# Patient Record
Sex: Female | Born: 2000 | Race: White | Hispanic: No | Marital: Single | Smoking: Never smoker
Health system: Southern US, Community
[De-identification: ages and names within clinical notes are randomized; demographics above are authoritative.]

## PROBLEM LIST (undated history)

## (undated) DIAGNOSIS — F4310 Post-traumatic stress disorder, unspecified: Secondary | ICD-10-CM

## (undated) DIAGNOSIS — F319 Bipolar disorder, unspecified: Secondary | ICD-10-CM

## (undated) DIAGNOSIS — F988 Other specified behavioral and emotional disorders with onset usually occurring in childhood and adolescence: Secondary | ICD-10-CM

## (undated) HISTORY — DX: Post-traumatic stress disorder, unspecified: F43.10

## (undated) HISTORY — PX: HYMENECTOMY: SHX987

## (undated) HISTORY — DX: Other specified behavioral and emotional disorders with onset usually occurring in childhood and adolescence: F98.8

## (undated) HISTORY — DX: Bipolar disorder, unspecified: F31.9

---
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
## 2018-08-15 ENCOUNTER — Emergency Department: Payer: Self-pay

## 2018-08-15 ENCOUNTER — Emergency Department
Admission: EM | Admit: 2018-08-15 | Discharge: 2018-08-15 | Disposition: A | Discharge status: Home / Self Care | Payer: Self-pay | Attending: Emergency Medicine | Admitting: Emergency Medicine

## 2018-08-15 ENCOUNTER — Encounter: Payer: Self-pay | Admitting: Emergency Medicine

## 2018-08-15 DIAGNOSIS — Z3A00 Weeks of gestation of pregnancy not specified: Secondary | ICD-10-CM | POA: Insufficient documentation

## 2018-08-15 DIAGNOSIS — R1084 Generalized abdominal pain: Secondary | ICD-10-CM | POA: Insufficient documentation

## 2018-08-15 DIAGNOSIS — O2341 Unspecified infection of urinary tract in pregnancy, first trimester: Secondary | ICD-10-CM

## 2018-08-15 DIAGNOSIS — O4690 Antepartum hemorrhage, unspecified, unspecified trimester: Secondary | ICD-10-CM

## 2018-08-15 DIAGNOSIS — O2391 Unspecified genitourinary tract infection in pregnancy, first trimester: Secondary | ICD-10-CM | POA: Insufficient documentation

## 2018-08-15 DIAGNOSIS — O200 Threatened abortion: Secondary | ICD-10-CM | POA: Insufficient documentation

## 2018-08-15 LAB — CBC WITH DIFFERENTIAL/PLATELET
Basophils Absolute: 0 10*3/uL (ref 0–0.1)
Basophils Relative: 0 %
EOS PCT: 1 %
Eosinophils Absolute: 0.1 10*3/uL (ref 0–0.7)
HEMATOCRIT: 37.7 % (ref 35.0–47.0)
Hemoglobin: 13.4 g/dL (ref 12.0–16.0)
LYMPHS ABS: 2 10*3/uL (ref 1.0–3.6)
LYMPHS PCT: 22 %
MCH: 28.4 pg (ref 26.0–34.0)
MCHC: 35.4 g/dL (ref 32.0–36.0)
MCV: 80.2 fL (ref 80.0–100.0)
Monocytes Absolute: 0.3 10*3/uL (ref 0.2–0.9)
Monocytes Relative: 4 %
Neutro Abs: 6.6 10*3/uL — ABNORMAL HIGH (ref 1.4–6.5)
Neutrophils Relative %: 73 %
PLATELETS: 261 10*3/uL (ref 150–440)
RBC: 4.71 MIL/uL (ref 3.80–5.20)
RDW: 14.3 % (ref 11.5–14.5)
WBC: 9 10*3/uL (ref 3.6–11.0)

## 2018-08-15 LAB — URINALYSIS, ROUTINE W REFLEX MICROSCOPIC
BILIRUBIN URINE: NEGATIVE
Glucose, UA: NEGATIVE mg/dL
Ketones, ur: NEGATIVE mg/dL
LEUKOCYTES UA: NEGATIVE
Nitrite: POSITIVE — AB
Protein, ur: NEGATIVE mg/dL
SPECIFIC GRAVITY, URINE: 1.023 (ref 1.005–1.030)
pH: 5 (ref 5.0–8.0)

## 2018-08-15 LAB — HCG, QUANTITATIVE, PREGNANCY: hCG, Beta Chain, Quant, S: 262 m[IU]/mL — ABNORMAL HIGH (ref <5)

## 2018-08-15 LAB — ABO/RH: ABO/RH(D): B POS

## 2018-08-15 MED ORDER — CEPHALEXIN 500 MG PO CAPS: 500.0000 mg | ORAL_CAPSULE | Freq: Two times a day (BID) | ORAL | 0 refills | Status: DC

## 2018-08-15 NOTE — ED Notes (Signed)
Pelvic e

## 2018-08-15 NOTE — ED Notes (Signed)
NAD noted at time of D/C. Pt denies questions or concerns. Pt ambulatory to the lobby at this time.  

## 2018-08-15 NOTE — ED Triage Notes (Signed)
Pt reports being six weeks pregnant which caused concern when pt began to bleed and cramp today. Pt reports a gush of blood an hour prior to arrival.

## 2018-08-15 NOTE — Discharge Instructions (Signed)
You have been seen in the Emergency Department (ED) for vaginal bleeding during pregnancy, which is called a threatened abortion.  Based on your ultrasound, and the fact that there is no gestational sac nor fetus visible, most likely you are, in fact, having a miscarriage.   It is still important for you to follow up in two days (Friday), though, with an OB/GYN to have repeat lab work drawn (blood HCG level) to see if your number is going up or down.  Today your HCG level was As a result of your blood type, you did not receive an injection of medication called Rhogam - please let your OB/Gyn know.  Please follow up as recommended above.  If you develop any other symptoms that concern you (including, but not limited to, persistent vomiting, worsening bleeding, abdominal or pelvic pain, or fever greater than 101), please return immediately to the Emergency Department.

## 2018-08-15 NOTE — ED Notes (Signed)
Pelvic exam done   No specimen to lab.  Pt tolerated exam well.

## 2018-08-15 NOTE — ED Provider Notes (Signed)
[facility] Emergency Department Provider Note  ____________________________________________   First MD Initiated Contact with Patient 08/15/18 1910     (approximate)  I have reviewed the triage vital signs and the nursing notes.   HISTORY  Chief Complaint Vaginal Bleeding    HPI Emy Angevine is a 17 y.o. female G1P0 at about 6 weeks based on LMP.  She presents today for acute onset of which she describes as severe vaginal bleeding.  She went to the health department today to establish care and was told that she needs to come back in about 6 weeks for an ultrasound but that it was too early to tell very much.  She states that after she went home she fill out bleeding; she describes it as a gush of blood at the surgery acutely and has been continuing although she has not gone to the bathroom since that occurred.  As soon as it started she asked her boyfriend to bring her to the emergency department.  She says that the bleeding is accompanied with some lower abdominal cramping that is occasionally severe but is currently mild.  It hurts right in the middle of her abdomen.  She denies fever/chills, chest pain, shortness of breath, nausea, vomiting, and dysuria.  Nothing in particular makes her symptoms better nor worse.  She does not feel lightheaded, dizzy, nor weak.  History reviewed. No pertinent past medical history.  There are no active problems to display for this patient.   History reviewed. No pertinent surgical history.  Prior to Admission medications   Medication Sig Start Date End Date Taking? Authorizing Provider  cephALEXin (KEFLEX) 500 MG capsule Take 1 capsule (500 mg total) by mouth 2 (two) times daily. 08/15/18   Hinda Kehr, MD    Allergies Prednisone  No family history on file.  Social History Social History   Tobacco Use  . Smoking status: Never Smoker  . Smokeless tobacco: Never Used  Substance Use Topics  . Alcohol use: No    . Drug use: Not on file    Review of Systems Constitutional: No fever/chills Eyes: No visual changes. ENT: No sore throat. Cardiovascular: Denies chest pain. Respiratory: Denies shortness of breath. Gastrointestinal: Lower abdominal cramping as described above.  No nausea, no vomiting.  No diarrhea.  No constipation. Genitourinary: Acute onset heavy vaginal bleeding as described above.  Negative for dysuria. Musculoskeletal: Negative for neck pain.  Negative for back pain. Integumentary: Negative for rash. Neurological: Negative for headaches, focal weakness or numbness.   ____________________________________________   PHYSICAL EXAM:  VITAL SIGNS: ED Triage Vitals  Enc Vitals Group     BP 08/15/18 1821 140/76     Pulse Rate 08/15/18 1821 95     Resp 08/15/18 1821 18     Temp 08/15/18 1821 97.9 F (36.6 C)     Temp Source 08/15/18 1821 Oral     SpO2 08/15/18 1821 99 %     Weight 08/15/18 1822 77.1 kg (170 lb)     Height 08/15/18 1822 1.524 m (5')     Head Circumference --      Peak Flow --      Pain Score 08/15/18 1822 7     Pain Loc --      Pain Edu? --      Excl. in [place]? --     Constitutional: Alert and oriented. Well appearing and in no acute distress. Eyes: Conjunctivae are normal.  Head: Atraumatic. Nose: No congestion/rhinnorhea. Mouth/Throat: Mucous  membranes are moist. Neck: No stridor.  No meningeal signs.   Cardiovascular: Normal rate, regular rhythm. Good peripheral circulation. Grossly normal heart sounds. Respiratory: Normal respiratory effort.  No retractions. Lungs CTAB. Gastrointestinal: Soft and nontender. No distention.  Genitourinary: Normal external exam.  Moderate amount of dark blood in the vaginal vault, no products of conception visible.  I could not get the speculum in position to completely visualize the cervix (the exam was somewhat limited by body habitus ) but I could not identify any large clots, products of conception, or other emergent  conditions.  There was no active bleeding at the time of the exam.  ED chaperone present throughout exam. Musculoskeletal: No lower extremity tenderness nor edema. No gross deformities of extremities. Neurologic:  Normal speech and language. No gross focal neurologic deficits are appreciated.  Skin:  Skin is warm, dry and intact. No rash noted. Psychiatric: Mood and affect are normal. Speech and behavior are normal.  ____________________________________________   LABS (all labs ordered are listed, but only abnormal results are displayed)  Labs Reviewed  HCG, QUANTITATIVE, PREGNANCY - Abnormal; Notable for the following components:      Result Value   hCG, Beta Chain, Quant, S 262 (*)    All other components within normal limits  CBC WITH DIFFERENTIAL/PLATELET - Abnormal; Notable for the following components:   Neutro Abs 6.6 (*)    All other components within normal limits  URINALYSIS, ROUTINE W REFLEX MICROSCOPIC - Abnormal; Notable for the following components:   Color, Urine YELLOW (*)    APPearance HAZY (*)    Hgb urine dipstick MODERATE (*)    Nitrite POSITIVE (*)    Bacteria, UA MANY (*)    All other components within normal limits  ABO/RH   ____________________________________________  EKG  None - EKG not ordered by ED physician ____________________________________________  RADIOLOGY  ED MD interpretation: No gestational sac, intrauterine fetus, nor ectopic pregnancy is visible, either too early or status post spontaneous abortion  Official radiology report(s): US Ob Comp < 14 Wks  Result Date: 08/15/2018 CLINICAL DATA:  Initial evaluation for heavy vaginal bleeding for 1 day, early pregnancy. EXAM: OBSTETRIC <14 WK [place] AND TRANSVAGINAL OB US TECHNIQUE: Both transabdominal and transvaginal ultrasound examinations were performed for complete evaluation of the gestation as well as the maternal uterus, adnexal regions, and pelvic cul-de-sac. Transvaginal technique was  performed to assess early pregnancy. COMPARISON:  None. FINDINGS: Intrauterine gestational sac: None visualized. Yolk sac:  Negative. Embryo:  Negative. Cardiac Activity: N/A Heart Rate: N/A  bpm Subchorionic hemorrhage:  None visualized. Maternal uterus/adnexae: Right ovary normal in appearance. Probable small corpus luteal cyst on the left measures 1.9 x 1.8 x 1.4 cm. Endometrial stripe thickened up to 12.9 mm. Uterus within normal limits. Small volume free physiologic fluid present within the pelvis. IMPRESSION: 1. Early pregnancy with no discrete IUP or adnexal mass identified. Finding is consistent with a pregnancy of unknown anatomic location. Differential considerations include IUP to early to visualize, recent SAB, or possibly occult ectopic pregnancy. Close clinical monitoring with serial beta HCGs and close interval follow-up ultrasound recommended as clinically warranted. 2. No other acute maternal uterine or adnexal abnormality identified. Electronically Signed   By: Jeannine Boga M.D.   On: 08/15/2018 20:33   US Ob Transvaginal  Result Date: 08/15/2018 CLINICAL DATA:  Initial evaluation for heavy vaginal bleeding for 1 day, early pregnancy. EXAM: OBSTETRIC <14 WK [place] AND TRANSVAGINAL OB US TECHNIQUE: Both transabdominal and transvaginal ultrasound examinations  were performed for complete evaluation of the gestation as well as the maternal uterus, adnexal regions, and pelvic cul-de-sac. Transvaginal technique was performed to assess early pregnancy. COMPARISON:  None. FINDINGS: Intrauterine gestational sac: None visualized. Yolk sac:  Negative. Embryo:  Negative. Cardiac Activity: N/A Heart Rate: N/A  bpm Subchorionic hemorrhage:  None visualized. Maternal uterus/adnexae: Right ovary normal in appearance. Probable small corpus luteal cyst on the left measures 1.9 x 1.8 x 1.4 cm. Endometrial stripe thickened up to 12.9 mm. Uterus within normal limits. Small volume free physiologic fluid present  within the pelvis. IMPRESSION: 1. Early pregnancy with no discrete IUP or adnexal mass identified. Finding is consistent with a pregnancy of unknown anatomic location. Differential considerations include IUP to early to visualize, recent SAB, or possibly occult ectopic pregnancy. Close clinical monitoring with serial beta HCGs and close interval follow-up ultrasound recommended as clinically warranted. 2. No other acute maternal uterine or adnexal abnormality identified. Electronically Signed   By: Jeannine Boga M.D.   On: 08/15/2018 20:33    ____________________________________________   PROCEDURES  Critical Care performed: No   Procedure(s) performed:   Procedures   ____________________________________________   INITIAL IMPRESSION / ASSESSMENT AND PLAN / ED COURSE  As part of my medical decision making, I reviewed the following data within the [facility] notes reviewed and incorporated, Labs reviewed  and Notes from prior ED visits    Differential diagnosis includes, but is not limited to, threatened miscarriage, incomplete miscarriage, normal bleeding from an early trimester pregnancy, ectopic pregnancy, , blighted ovum, vaginal/cervical trauma, subchorionic hemorrhage/hematoma, etc.  Based on the physical exam with a significant amount of bleeding, the low hCG, and the ultrasound which had essentially no findings, I strongly suspect that the patient is actively having a spontaneous abortion or has completed it and only has some residual bleeding at this point.  However I had my usual customary discussion with the patient explained that she needs to follow-up within 2 days for repeat hCG and I also explained that it is still theoretically possible that this is simply some bleeding in early pregnancy and it is too early to know for sure, but I did explain to her that I thought it was most likely she is having a miscarriage.  She is Rh+ and does not need  RhoGam.  I stressed her a couple of times the importance of close outpatient follow-up as well as gave her my usual and customary return precautions.  She understands and agrees with the plan and was sad but appropriate under the circumstances.  Given that her urinalysis was nitrite positive, I also did treat empirically with Keflex.     ____________________________________________  FINAL CLINICAL IMPRESSION(S) / ED DIAGNOSES  Final diagnoses:  Vaginal bleeding in pregnancy  Urinary tract infection in mother during first trimester of pregnancy  Threatened miscarriage in early pregnancy     MEDICATIONS GIVEN DURING THIS VISIT:  Medications - No data to display   ED Discharge Orders         Ordered    cephALEXin (KEFLEX) 500 MG capsule  2 times daily     08/15/18 2122           Note:  This document was prepared using Dragon voice recognition software and may include unintentional dictation errors.    Hinda Kehr, MD 08/15/18 2248

## 2018-08-15 NOTE — ED Notes (Signed)
ED Provider at bedside. 

## 2018-08-15 NOTE — ED Notes (Signed)
Patient transported to Ultrasound 

## 2018-08-18 ENCOUNTER — Other Ambulatory Visit: Payer: Self-pay

## 2018-08-18 ENCOUNTER — Encounter: Payer: Self-pay | Admitting: Emergency Medicine

## 2018-08-18 ENCOUNTER — Emergency Department
Admission: EM | Admit: 2018-08-18 | Discharge: 2018-08-18 | Disposition: A | Discharge status: Home / Self Care | Payer: Self-pay | Attending: Emergency Medicine | Admitting: Emergency Medicine

## 2018-08-18 DIAGNOSIS — O039 Complete or unspecified spontaneous abortion without complication: Secondary | ICD-10-CM | POA: Insufficient documentation

## 2018-08-18 DIAGNOSIS — Z3A00 Weeks of gestation of pregnancy not specified: Secondary | ICD-10-CM | POA: Insufficient documentation

## 2018-08-18 LAB — CBC WITH DIFFERENTIAL/PLATELET
BASOS PCT: 1 %
Basophils Absolute: 0.1 10*3/uL (ref 0–0.1)
EOS ABS: 0.2 10*3/uL (ref 0–0.7)
Eosinophils Relative: 2 %
HEMATOCRIT: 38.3 % (ref 35.0–47.0)
HEMOGLOBIN: 13.1 g/dL (ref 12.0–16.0)
Lymphocytes Relative: 25 %
Lymphs Abs: 2.7 10*3/uL (ref 1.0–3.6)
MCH: 27.6 pg (ref 26.0–34.0)
MCHC: 34.2 g/dL (ref 32.0–36.0)
MCV: 80.6 fL (ref 80.0–100.0)
Monocytes Absolute: 0.6 10*3/uL (ref 0.2–0.9)
Monocytes Relative: 5 %
NEUTROS ABS: 7.3 10*3/uL — AB (ref 1.4–6.5)
NEUTROS PCT: 67 %
Platelets: 274 10*3/uL (ref 150–440)
RBC: 4.76 MIL/uL (ref 3.80–5.20)
RDW: 13.8 % (ref 11.5–14.5)
WBC: 10.8 10*3/uL (ref 3.6–11.0)

## 2018-08-18 LAB — COMPREHENSIVE METABOLIC PANEL
ALBUMIN: 3.9 g/dL (ref 3.5–5.0)
ALK PHOS: 63 U/L (ref 38–126)
ALT: 14 U/L (ref 0–44)
ANION GAP: 8 (ref 5–15)
AST: 16 U/L (ref 15–41)
BUN: 11 mg/dL (ref 6–20)
CALCIUM: 9.4 mg/dL (ref 8.9–10.3)
CO2: 24 mmol/L (ref 22–32)
Chloride: 104 mmol/L (ref 98–111)
Creatinine, Ser: 0.86 mg/dL (ref 0.44–1.00)
GFR calc Af Amer: >60 mL/min (ref >60)
GFR calc non Af Amer: >60 mL/min (ref >60)
GLUCOSE: 92 mg/dL (ref 70–99)
Potassium: 4.2 mmol/L (ref 3.5–5.1)
SODIUM: 136 mmol/L (ref 135–145)
Total Bilirubin: 0.4 mg/dL (ref 0.3–1.2)
Total Protein: 7.5 g/dL (ref 6.5–8.1)

## 2018-08-18 LAB — HCG, QUANTITATIVE, PREGNANCY: hCG, Beta Chain, Quant, S: 95 m[IU]/mL — ABNORMAL HIGH (ref <5)

## 2018-08-18 MED ORDER — MISOPROSTOL 200 MCG PO TABS
600.0000 ug | ORAL_TABLET | Freq: Once | ORAL | Status: AC
  Administered 2018-08-18: 600 ug via ORAL
  Filled 2018-08-18: qty 3

## 2018-08-18 MED ORDER — IBUPROFEN 800 MG PO TABS: 800.0000 mg | ORAL_TABLET | Freq: Three times a day (TID) | ORAL | 0 refills | Status: DC | PRN

## 2018-08-18 NOTE — ED Notes (Signed)
Pelvic cart setup at bedside.  Being performed by Dr. Clearnce Hasten and Levada Dy, [place].

## 2018-08-18 NOTE — ED Triage Notes (Signed)
Vaginal bleeding x 2 days. Seen here and was told she had miscarried. Vaginal bleeding and pain has increased. Using approx 9 pads per day.

## 2018-08-18 NOTE — ED Provider Notes (Signed)
[facility] Emergency Department Provider Note ____________________________________________   First MD Initiated Contact with Patient 08/18/18 2135     (approximate)  I have reviewed the triage vital signs and the nursing notes.   HISTORY  Chief Complaint Vaginal Bleeding  HPI Theresa Turner is a 17 y.o. female without any chronic medical conditions was presented to the emergency department today with worsening vaginal bleeding.  She was seen on 20 August in this emergency department for vaginal bleeding.  She was found to have a low beta-hCG and a pregnancy of unknown origin on her ultrasound.  She says that since then her bleeding has increased and she is having intermittent pain to the lower abdomen which radiates into her back and can be severe at times.  She rates her pain is a 9 out of 10 at this time.  Says that she is soaking approximately 9 pads today.  Says that she has not picked up the antibiotics for the UTI which she was diagnosed with several days ago.  Does not have established care with OB/GYN.  History reviewed. No pertinent past medical history.  There are no active problems to display for this patient.   History reviewed. No pertinent surgical history.  Prior to Admission medications   Medication Sig Start Date End Date Taking? Authorizing Provider  cephALEXin (KEFLEX) 500 MG capsule Take 1 capsule (500 mg total) by mouth 2 (two) times daily. 08/15/18   Hinda Kehr, MD    Allergies Prednisone  No family history on file.  Social History Social History   Tobacco Use  . Smoking status: Never Smoker  . Smokeless tobacco: Never Used  Substance Use Topics  . Alcohol use: No  . Drug use: Not on file    Review of Systems  Constitutional: No fever/chills Eyes: No visual changes. ENT: No sore throat. Cardiovascular: Denies chest pain. Respiratory: Denies shortness of breath. Gastrointestinal:   No nausea, no vomiting.  No  diarrhea.  No constipation. Genitourinary: As above Musculoskeletal: Negative for back pain. Skin: Negative for rash. Neurological: Negative for headaches, focal weakness or numbness.   ____________________________________________   PHYSICAL EXAM:  VITAL SIGNS: ED Triage Vitals  Enc Vitals Group     BP 08/18/18 2112 130/81     Pulse Rate 08/18/18 2112 86     Resp 08/18/18 2112 18     Temp 08/18/18 2112 98.2 F (36.8 C)     Temp Source 08/18/18 2112 Oral     SpO2 08/18/18 2112 99 %     Weight 08/18/18 2113 225 lb (102.1 kg)     Height 08/18/18 2113 5\' 5"  (1.651 m)     Head Circumference --      Peak Flow --      Pain Score 08/18/18 2112 9     Pain Loc --      Pain Edu? --      Excl. in [place]? --     Constitutional: Alert and oriented. Well appearing and in no acute distress.  Despite the 9 out of 10 pain reported by the patient the patient appears calm.   Eyes: Conjunctivae are normal.  Head: Atraumatic. Nose: No congestion/rhinnorhea. Mouth/Throat: Mucous membranes are moist.  Neck: No stridor.   Cardiovascular: Normal rate, regular rhythm. Grossly normal heart sounds.  Respiratory: Normal respiratory effort.  No retractions. Lungs CTAB. Gastrointestinal: Soft with minimal suprapubic tenderness to palpation.. No distention. Genitourinary: Normal external exam without any lesions.  Speculum exam with very small  amount of clot just posterior to the cervical os.  Clot was cleared with a Q-tip and there is no pooling.  Very small trickle if any active bleeding from the cervix with maroon blood at this time.  Bimanual exam with a closed cervical loss.  No CMT.  Minimal uterine and adnexal tenderness to palpation without any masses palpated. Musculoskeletal: No lower extremity tenderness nor edema.  No joint effusions. Neurologic:  Normal speech and language. No gross focal neurologic deficits are appreciated. Skin:  Skin is warm, dry and intact. No rash noted. Psychiatric: Mood  and affect are normal. Speech and behavior are normal.  ____________________________________________   LABS (all labs ordered are listed, but only abnormal results are displayed)  Labs Reviewed  CBC WITH DIFFERENTIAL/PLATELET - Abnormal; Notable for the following components:      Result Value   Neutro Abs 7.3 (*)    All other components within normal limits  HCG, QUANTITATIVE, PREGNANCY - Abnormal; Notable for the following components:   hCG, Beta Chain, Quant, S 95 (*)    All other components within normal limits  COMPREHENSIVE METABOLIC PANEL   ____________________________________________  EKG   ____________________________________________  RADIOLOGY   ____________________________________________   PROCEDURES  Procedure(s) performed:   Procedures  Critical Care performed:   ____________________________________________   INITIAL IMPRESSION / ASSESSMENT AND PLAN / ED COURSE  Pertinent labs & imaging results that were available during my care of the patient were reviewed by me and considered in my medical decision making (see chart for details).  Differential diagnosis includes, but is not limited to, threatened miscarriage, incomplete miscarriage, normal bleeding from an early trimester pregnancy, ectopic pregnancy, , blighted ovum, vaginal/cervical trauma, subchorionic hemorrhage/hematoma, etc. As part of my medical decision making, I reviewed the following data within the electronic MEDICAL RECORD NUMBER Notes from prior ED visits  ----------------------------------------- 10:46 PM on 08/18/2018 -----------------------------------------  I discussed the case with Dr. Marcelline Mates of OB/GYN who recommends 600 mg of Cytotec and follow-up in the clinic.  We discussed the imaging as well as the downtrending hCG level.  Patient may need evaluation for D&C but we will try medical miscarriage initially.  Patient received 800 mg as needed ibuprofen for home use.  Given return  precautions.  She is understanding of the treatment plan and willing to comply.  Says that she will also be dealing the prescription for the UTI. ____________________________________________   FINAL CLINICAL IMPRESSION(S) / ED DIAGNOSES  Miscarriage in progress.  NEW MEDICATIONS STARTED DURING THIS VISIT:  New Prescriptions   No medications on file     Note:  This document was prepared using Dragon voice recognition software and may include unintentional dictation errors.     Orbie Pyo, MD 08/18/18 [phone]

## 2018-08-18 NOTE — ED Notes (Signed)
Pt given water to drink. 

## 2019-05-20 HISTORY — PX: WISDOM TOOTH EXTRACTION: SHX21

## 2019-06-19 ENCOUNTER — Other Ambulatory Visit: Payer: Self-pay | Admitting: Ophthalmology

## 2019-06-19 DIAGNOSIS — H4710 Unspecified papilledema: Secondary | ICD-10-CM

## 2019-07-05 ENCOUNTER — Other Ambulatory Visit: Payer: Self-pay

## 2019-07-05 ENCOUNTER — Ambulatory Visit
Admission: RE | Admit: 2019-07-05 | Discharge: 2019-07-05 | Disposition: A | Discharge status: Home / Self Care | Payer: Medicaid Other | Source: Ambulatory Visit | Attending: Ophthalmology | Admitting: Ophthalmology

## 2019-07-05 DIAGNOSIS — H4710 Unspecified papilledema: Secondary | ICD-10-CM

## 2019-07-05 MED ORDER — GADOBUTROL 1 MMOL/ML IV SOLN
10.0000 mL | Freq: Once | INTRAVENOUS | Status: AC | PRN
  Administered 2019-07-05: 10 mL via INTRAVENOUS

## 2019-07-10 ENCOUNTER — Other Ambulatory Visit: Payer: Self-pay

## 2019-07-10 ENCOUNTER — Encounter: Payer: Self-pay | Admitting: Maternal Newborn

## 2019-07-10 ENCOUNTER — Ambulatory Visit (INDEPENDENT_AMBULATORY_CARE_PROVIDER_SITE_OTHER): Payer: Medicaid Other | Admitting: Maternal Newborn

## 2019-07-10 ENCOUNTER — Other Ambulatory Visit (HOSPITAL_COMMUNITY)
Admission: RE | Admit: 2019-07-10 | Discharge: 2019-07-10 | Disposition: A | Discharge status: Home / Self Care | Payer: Medicaid Other | Source: Ambulatory Visit | Attending: Maternal Newborn | Admitting: Maternal Newborn

## 2019-07-10 VITALS — BP 100/80 | Ht 65.0 in | Wt 241.8 lb

## 2019-07-10 DIAGNOSIS — E349 Endocrine disorder, unspecified: Secondary | ICD-10-CM

## 2019-07-10 DIAGNOSIS — N898 Other specified noninflammatory disorders of vagina: Secondary | ICD-10-CM

## 2019-07-10 DIAGNOSIS — Z0000 Encounter for general adult medical examination without abnormal findings: Secondary | ICD-10-CM

## 2019-07-10 DIAGNOSIS — G4489 Other headache syndrome: Secondary | ICD-10-CM

## 2019-07-10 DIAGNOSIS — L293 Anogenital pruritus, unspecified: Secondary | ICD-10-CM

## 2019-07-10 DIAGNOSIS — Z01419 Encounter for gynecological examination (general) (routine) without abnormal findings: Secondary | ICD-10-CM

## 2019-07-10 DIAGNOSIS — Z1329 Encounter for screening for other suspected endocrine disorder: Secondary | ICD-10-CM

## 2019-07-10 MED ORDER — CLOTRIMAZOLE-BETAMETHASONE 1-0.05 % EX CREA: 1.0000 "application " | TOPICAL_CREAM | Freq: Two times a day (BID) | CUTANEOUS | 0 refills | Status: DC

## 2019-07-10 NOTE — Progress Notes (Signed)
Gynecology Annual Exam  PCP: Theresa Berry, NP  Chief Complaint:  Chief Complaint  Patient presents with  . Gynecologic Exam  . Contraception    interested in [facility]    History of Present Illness: Patient is a 18 y.o. G1P0010 presenting for an annual exam.   LMP: Patient's last menstrual period was 06/15/2019 (exact date). Average Interval: regular, 28 to 30 days Duration of flow: 7 days Heavy Menses: yes, on days 2-4 of cycle Clots: no Intermenstrual Bleeding: no Postcoital Bleeding: no Dysmenorrhea: yes, moderate  The patient is sexually active. She currently uses condoms for contraception. She does not  Have dyspareunia.  The patient does perform self breast exams.  There is no notable family history of breast or ovarian cancer in her family.  The patient wears seatbelts: yes.  The patient has regular exercise: no.    The patient has current symptoms of depression and anxiety. She has recently seen a provider for this problem and has started on medication.    She has migraines without aura, tinnitus, and swelling of her eyes. She was evaluated by MRI a few days ago and had an unremarkable exam. She was referred to neurology.  Review of Systems  Constitutional: Negative.   HENT: Positive for tinnitus.   Eyes:       Swelling  Respiratory: Negative for shortness of breath and wheezing.   Cardiovascular: Negative for chest pain and palpitations.  Gastrointestinal: Negative.   Genitourinary:       Vaginal irritation  Musculoskeletal: Negative.   Skin: Negative.   Neurological: Positive for headaches.  Endo/Heme/Allergies: Negative.   Psychiatric/Behavioral: Positive for depression. The patient is nervous/anxious.   All other systems reviewed and are negative.   Past Medical History:  Past Medical History:  Diagnosis Date  . Attention deficit disorder (ADD) without hyperactivity   . Bipolar disorder ([place])   . Post traumatic stress disorder     Past Surgical  History:  Past Surgical History:  Procedure Laterality Date  . HYMENECTOMY    . LABIOPLASTY  05/2016   [facility]  . WISDOM TOOTH EXTRACTION  05/2019    Gynecologic History:  Patient's last menstrual period was 06/15/2019 (exact date). Contraception: condoms Last Pap: N/A, screening not indicated, less than 73 years old  Obstetric History: G1P0010  Family History:  Family History  Problem Relation Age of Onset  . Diabetes Mother   . Breast cancer Other     Social History:  Social History   Socioeconomic History  . Marital status: Single    Spouse name: Not on file  . Number of children: Not on file  . Years of education: Not on file  . Highest education level: Not on file  Occupational History  . Not on file  Social Needs  . Financial resource strain: Not on file  . Food insecurity    Worry: Not on file    Inability: Not on file  . Transportation needs    Medical: Not on file    Non-medical: Not on file  Tobacco Use  . Smoking status: Never Smoker  . Smokeless tobacco: Never Used  Substance and Sexual Activity  . Alcohol use: No  . Drug use: Not Currently  . Sexual activity: Yes    Birth control/protection: Condom  Lifestyle  . Physical activity    Days per week: Not on file    Minutes per session: Not on file  . Stress: Not on file  Relationships  .  Social Herbalist on phone: Not on file    Gets together: Not on file    Attends religious service: Not on file    Active member of club or organization: Not on file    Attends meetings of clubs or organizations: Not on file    Relationship status: Not on file  . Intimate partner violence    Fear of current or ex partner: Not on file    Emotionally abused: Not on file    Physically abused: Not on file    Forced sexual activity: Not on file  Other Topics Concern  . Not on file  Social History Narrative  . Not on file    Allergies:  Allergies  Allergen Reactions  . Prednisone Other (See  Comments)    Unable to sleep    Medications: Prior to Admission medications   Not on File    Physical Exam Vitals: Blood pressure 100/80, height 5\' 5"  (1.651 m), weight 241 lb 12.8 oz (109.7 kg), last menstrual period 06/15/2019.  General: NAD HEENT: normocephalic, anicteric  Thyroid: no enlargement, no palpable nodules Pulmonary: No increased work of breathing, CTAB Cardiovascular: RRR, no murmurs, rubs, or gallops Breast: Breasts symmetrical, no tenderness, no palpable nodules or masses, no skin or nipple retraction present, no nipple discharge.  No axillary or supraclavicular lymphadenopathy. Abdomen: Soft, non-tender, non-distended.  Umbilicus without lesions.  No hepatomegaly, splenomegaly or masses palpable. No evidence of hernia  Genitourinary:  External: Normal external female genitalia.  Normal urethral  meatus, normal Bartholin's and Skene's glands.    Vagina: Vaginal mucosa appear irritated, small amount of  thin white discharge, no evidence of prolapse.    Rectal: deferred  Lymphatic: no evidence of inguinal lymphadenopathy Extremities: no edema, erythema, or tenderness Neurologic: Grossly intact Psychiatric: mood appropriate, affect full  Assessment: 18 y.o. G1P0010 annual exam and contraceptive counseling  Plan: Problem List Items Addressed This Visit    None    Visit Diagnoses    Women's annual routine gynecological examination    -  Primary   Screening for thyroid disorder       Relevant Orders   TSH + free T4   Headache associated with hormonal factors       Hormone imbalance       Relevant Orders   Estrogens, total   Cortisol   Testosterone   Vaginal irritation       Relevant Orders   Cervicovaginal ancillary only   Perineal irritation       Relevant Medications   clotrimazole-betamethasone (LOTRISONE) cream      1) Desires lab testing for symptoms such as weight gain, mood swings, headaches etc. Labs ordered.  2) Contraception - Education  given regarding options for contraception, including contraceptive patch, NuvaRing, NexplanonShe has used OCPs, Depo, and Mirena/Skyla IUDs in the past and does not want to restart any of those methods. Patient is interested in Nexplanon or possibly the vaginal ring or patch. Will follow up for Rx or appointment when she decides.  3) Pap not indicated, less than 35 years old.  4) Currently being seen or has referrals to other providers for headaches, anxiety and depression.  5) Aptima sent to rule out BV/yeast infection. Rx for lotrisone cream sent for external irritation.  6) Follow up 1 year for an annual exam.  Avel Sensor, CNM 07/10/2019  4:53 PM

## 2019-07-12 ENCOUNTER — Other Ambulatory Visit: Payer: Self-pay | Admitting: Maternal Newborn

## 2019-07-12 DIAGNOSIS — B9689 Other specified bacterial agents as the cause of diseases classified elsewhere: Secondary | ICD-10-CM

## 2019-07-12 LAB — CERVICOVAGINAL ANCILLARY ONLY
Bacterial vaginitis: POSITIVE — AB
Candida vaginitis: NEGATIVE

## 2019-07-12 MED ORDER — METRONIDAZOLE 500 MG PO TABS: 500.0000 mg | ORAL_TABLET | Freq: Two times a day (BID) | ORAL | 0 refills | Status: AC

## 2019-07-12 NOTE — Progress Notes (Signed)
Prescription sent for metronidazole to treat bacterial vaginosis.

## 2019-07-13 LAB — TSH+FREE T4
Free T4: 0.98 ng/dL (ref 0.93–1.60)
TSH: 2.69 u[IU]/mL (ref 0.450–4.500)

## 2019-07-13 LAB — CORTISOL: Cortisol: 11.7 ug/dL

## 2019-07-13 LAB — TESTOSTERONE: Testosterone: 47 ng/dL

## 2019-07-13 LAB — ESTROGENS, TOTAL: Estrogen: 255 pg/mL

## 2020-05-20 ENCOUNTER — Ambulatory Visit: Payer: Medicaid Other | Admitting: Dermatology

## 2020-05-20 ENCOUNTER — Other Ambulatory Visit: Payer: Self-pay

## 2020-05-20 DIAGNOSIS — Z872 Personal history of diseases of the skin and subcutaneous tissue: Secondary | ICD-10-CM | POA: Diagnosis not present

## 2020-05-20 DIAGNOSIS — B079 Viral wart, unspecified: Secondary | ICD-10-CM

## 2020-05-20 DIAGNOSIS — L7211 Pilar cyst: Secondary | ICD-10-CM | POA: Diagnosis not present

## 2020-05-20 NOTE — Patient Instructions (Addendum)
Recommend daily broad spectrum sunscreen SPF 30+ to sun-exposed areas, reapply every 2 hours as needed. Call for new or changing lesions.  Pre-Operative Instructions  You are scheduled for a surgical procedure at [facility]. We recommend you read the following instructions. If you have any questions or concerns, please call the office at [phone].  1. Shower and wash the entire body with soap and water the day of your surgery paying special attention to cleansing at and around the planned surgery site.  2. Avoid aspirin or aspirin containing products at least fourteen (14) days prior to your surgical procedure and for at least one week (7 Days) after your surgical procedure. If you take aspirin on a regular basis for heart disease or history of stroke or for any other reason, we may recommend you continue taking aspirin but please notify us if you take this on a regular basis. Aspirin can cause more bleeding to occur during surgery as well as prolonged bleeding and bruising after surgery.   3. Avoid other nonsteroidal pain medications at least one week prior to surgery and at least one week prior to your surgery. These include medications such as Ibuprofen (Motrin, Advil and Nuprin), Naprosyn, Voltaren, Relafen, etc. If medications are used for therapeutic reasons, please inform us as they can cause increased bleeding or prolonged bleeding during and bruising after surgical procedures.   4. Please advice [place] if you are taking any "blood thinner" medications such as Coumadin or Dipyridamole or Plavix or similar medications. These cause increased bleeding and prolonged bleeding during and bruising after surgical procedures. We may have to consider discontinuing these medications briefly prior to and shortly after your surgery, if safe to do so.   5. Please inform us of all medications you are currently taking. All medications that are taken regularly should be taken the day of surgery as you  always do. Nevertheless, we need to be informed of what medications you are taking prior to surgery to whether they will affect the procedure or cause any complications.   6. Please inform us of any medication allergies. Also inform us of whether you have allergies to Latex or rubber products or whether you have had any adverse reaction to Lidocaine or Epinephrine.  7. Please inform us of any prosthetic or artificial body parts such as artificial heart valve, joint replacements, etc., or similar condition that might require preoperative antibiotics.   8. We recommend avoidance of alcohol at least two weeks prior to surgery and continued avoidence for at least two weeks after surgery.   9. We recommend discontinuation of tobacco smoking at least two weeks prior to surgery and continued abstinence for at least two weeks after surgery.  10. Do not plan strenuous exercise, strenuous work or strenuous lifting for approximately four weeks after your surgery.   11. We request if you are unable to make your scheduled surgical appointment, please call us at least a week in advance or as soon as you are aware of a problem sot aht we can cancel or reschedule you.   12. You MAKE TAKE TYLENOL (acetaminophen) for pain as it is not a blood thinner.   13. PLEASE PLAN TO BE IN TOWN FOR TWO WEEKS FOLLOWING SURGERY, THIS IS IMPORTANT SO YOU CAN BE CHECKED FOR DRESSING CHANGES, SUTURE REMOVAL AND TO MONITOR FOR POSSIBLE COMPLICATIONS.   Discussed viral etiology and risk of spread.  Discussed multiple treatments may be required to clear warts.  Discussed possible post-treatment dyspigmentation  and risk of recurrence.  Prior to procedure, discussed risks of blister formation, small wound, skin dyspigmentation, or rare scar following cryotherapy.   Cryotherapy Aftercare  . Wash gently with soap and water everyday.   Marland Kitchen Apply Vaseline and Band-Aid daily until healed. Marland Kitchen

## 2020-05-20 NOTE — Progress Notes (Signed)
   New Patient Visit  Subjective  Theresa Turner is a 19 y.o. female who presents for the following: New Patient (Initial Visit) (Wart x 5 year on right hand that hurt, lump on head for about 19 years old, tender to touch, also has a few more smaller lumps on scalp).  She would like the larger one removed.   The following portions of the chart were reviewed this encounter and updated as appropriate:      Review of Systems:  No other skin or systemic complaints except as noted in HPI or Assessment and Plan.  Objective  Well appearing patient in no apparent distress; mood and affect are within normal limits.  A focused examination was performed including Scalp and right hand. Relevant physical exam findings are noted in the Assessment and Plan.  Objective  Mid Occipital Scalp, Right Parietal Scalp: 1.5 cm firm sub q nodule Right Parietal scalp  0.8 cm firm sub Q nodule Occipital scalp  Objective  Right Palm, Right Palmar Index Finger (2), Right Thumb Palmar: Verrucous papules -- Discussed viral etiology and contagion.    Assessment & Plan  Pilar cyst (2) Right Parietal Scalp; Mid Occipital Scalp  Cyst with symptoms and/or recent change.  Discussed surgical excision to remove, including resulting scar and possible recurrence.  Patient will schedule for surgery. Pre-op information given.   Viral warts, unspecified type (4) Right Palm; Right Thumb Palmar; Right Palmar Index Finger (2)  Discussed viral etiology and risk of spread.  Discussed multiple treatments may be required to clear warts.  Discussed possible post-treatment dyspigmentation and risk of recurrence.  Cryotherapy today Prior to procedure, discussed risks of blister formation, small wound, skin dyspigmentation, or rare scar following cryotherapy.    Destruction of lesion - Right Palm, Right Palmar Index Finger, Right Thumb Palmar  Destruction method: cryotherapy   Informed consent: discussed and consent  obtained   Lesion destroyed using liquid nitrogen: Yes   Region frozen until ice ball extended beyond lesion: Yes   Outcome: patient tolerated procedure well with no complications   Post-procedure details: wound care instructions given    Return in about 1 month (around 06/19/2020) for  1 month wart and surgery for cyst.  I, Donzetta Kohut, CMA, am acting as scribe for Brendolyn Patty, MD .  Documentation: I have reviewed the above documentation for accuracy and completeness, and I agree with the above.  Brendolyn Patty MD

## 2020-06-29 ENCOUNTER — Ambulatory Visit (INDEPENDENT_AMBULATORY_CARE_PROVIDER_SITE_OTHER): Payer: Medicaid Other | Admitting: Dermatology

## 2020-06-29 ENCOUNTER — Other Ambulatory Visit: Payer: Self-pay

## 2020-06-29 DIAGNOSIS — L720 Epidermal cyst: Secondary | ICD-10-CM | POA: Diagnosis not present

## 2020-06-29 DIAGNOSIS — B079 Viral wart, unspecified: Secondary | ICD-10-CM

## 2020-06-29 NOTE — Progress Notes (Signed)
   Follow-Up Visit   Subjective  Theresa Turner is a 19 y.o. female who presents for the following: Cyst (scalp, pt presents for exc) and Warts (Right Palm; Right Thumb Palmar; Right Palmar Index Finger (2), new ones on L hand and L great toe). Has another cyst on back of scalp that she would like removed.   The following portions of the chart were reviewed this encounter and updated as appropriate:      Review of Systems:  No other skin or systemic complaints except as noted in HPI or Assessment and Plan.  Objective  Well appearing patient in no apparent distress; mood and affect are within normal limits.  A focused examination was performed including scalp. Relevant physical exam findings are noted in the Assessment and Plan.  Objective  R posterior parietal scalp: 1.6cm sub q nodule  Objective  R thumb palmar, R palmar index finger x 2, R palm, L hand and L great toe: Verrucous papules -- Discussed viral etiology and contagion.    Assessment & Plan  Epidermal cyst R posterior parietal scalp  Skin excision - R posterior parietal scalp  Lesion length (cm):  1.6 Lesion width (cm):  1.6 Margin per side (cm):  0 Total excision diameter (cm):  1.6 Informed consent: discussed and consent obtained   Timeout: patient name, date of birth, surgical site, and procedure verified   Procedure prep:  Patient was prepped and draped in usual sterile fashion Prep type:  Povidone-iodine Anesthesia: the lesion was anesthetized in a standard fashion   Anesthetic:  1% lidocaine w/ epinephrine 1-100,000 buffered w/ 8.4% NaHCO3 (6.0cc) Instrument used comment:  15c blade Hemostasis achieved with: pressure and electrodesiccation   Outcome: patient tolerated procedure well with no complications    Skin repair - R posterior parietal scalp Complexity:  Simple Final length (cm):  1.4 Undermining: edges could be approximated without difficulty   Fine/surface layer approximation (top  stitches):  Suture size:  3-0 Suture type: Prolene (polypropylene)   Stitches: vertical mattress and simple interrupted   Hemostasis achieved with: pressure Outcome: patient tolerated procedure well with no complications   Post-procedure details: wound care instructions given   Additional details:  Mupirocin ointment applied  Specimen 1 - Surgical pathology Differential Diagnosis: D48.5 Cyst vs other Check Margins: No 1.6cm sub q nodule  Viral warts, unspecified type R thumb palmar, R palmar index finger x 2, R palm, L hand and L great toe  Plan Ln2 at post op  Return in about 1 week (around 07/06/2020) for sr and treat warts, schedule excision appt for occipital scalp cyst.  Documentation: I have reviewed the above documentation for accuracy and completeness, and I agree with the above.  Brendolyn Patty MD

## 2020-06-29 NOTE — Patient Instructions (Signed)

## 2020-06-30 ENCOUNTER — Telehealth: Payer: Self-pay

## 2020-06-30 NOTE — Telephone Encounter (Signed)
Pt having a little pain after yesterdays surgery.  I advised she can take 2 extra strength tylenol or 1 extra strength tylenol and 2 200mg  ibuprofen.  Advised to call the office if the above was not helping./sh

## 2020-07-06 ENCOUNTER — Ambulatory Visit (INDEPENDENT_AMBULATORY_CARE_PROVIDER_SITE_OTHER): Payer: Medicaid Other | Admitting: Dermatology

## 2020-07-06 ENCOUNTER — Other Ambulatory Visit: Payer: Self-pay

## 2020-07-06 DIAGNOSIS — L7211 Pilar cyst: Secondary | ICD-10-CM

## 2020-07-06 DIAGNOSIS — B079 Viral wart, unspecified: Secondary | ICD-10-CM

## 2020-07-06 DIAGNOSIS — Z4802 Encounter for removal of sutures: Secondary | ICD-10-CM

## 2020-07-06 NOTE — Progress Notes (Deleted)
   Follow-Up Visit   Subjective  Theresa Turner is a 19 y.o. female who presents for the following: suture removal (cyst, bx proven, of the scalp) and Warts (fingers, L great toe).  The following portions of the chart were reviewed this encounter and updated as appropriate:      Review of Systems:  No other skin or systemic complaints except as noted in HPI or Assessment and Plan.  Objective  Well appearing patient in no apparent distress; mood and affect are within normal limits.  A focused examination was performed including scalp, fingers, great toe. Relevant physical exam findings are noted in the Assessment and Plan.    Assessment & Plan   No follow-ups on file.   IJamesetta Orleans, CMA, am acting as scribe for Brendolyn Patty, MD .

## 2020-07-06 NOTE — Progress Notes (Signed)
   Follow-Up Visit   Subjective  Theresa Turner is a 19 y.o. female who presents for the following: suture removal (cyst, bx proven, of the scalp) and Warts (fingers, L great toe).  No pain or bleeding from surgery, doing well.  This is second treatment for warts, they are improving, but she has a couple new ones.   The following portions of the chart were reviewed this encounter and updated as appropriate:      Review of Systems:  No other skin or systemic complaints except as noted in HPI or Assessment and Plan.  Objective  Well appearing patient in no apparent distress; mood and affect are within normal limits.  A focused examination was performed including scalp, fingers, hands, toe. Relevant physical exam findings are noted in the Assessment and Plan.  Objective  Right Posterior Parietal Scalp: Excision site healing well, no evidence of infection   Objective  R palm x 1, R index fingertip palmar x 1, R thumb x 1, L index palmar x 1, L plantar great toe x 1 (5): Small residual verrucous papule on R palm, R thumb. Verrucous plaque R index fingertip palmar with thinning. Tiny verrucous papule on L index palmar, L plantar great toe firm verrucous papule 3 mm   Assessment & Plan  Pilar cyst Right Posterior Parietal Scalp  Wound cleansed and sutures removed. Discussed pathology results.  Benign cyst   Viral warts, unspecified type (5) R palm x 1, R index fingertip palmar x 1, R thumb x 1, L index palmar x 1, L plantar great toe x 1  Discussed viral etiology and risk of spread.  Discussed multiple treatments may be required to clear warts.  Discussed possible post-treatment dyspigmentation and risk of recurrence.   Destruction of lesion - R palm x 1, R index fingertip palmar x 1, R thumb x 1, L index palmar x 1, L plantar great toe x 1  Destruction method: cryotherapy   Informed consent: discussed and consent obtained   Lesion destroyed using liquid nitrogen: Yes     Region frozen until ice ball extended beyond lesion: Yes   Outcome: patient tolerated procedure well with no complications   Post-procedure details: wound care instructions given    Return in about 1 month (around 08/06/2020) for warts.   IJamesetta Orleans, CMA, am acting as scribe for Brendolyn Patty, MD .  Documentation: I have reviewed the above documentation for accuracy and completeness, and I agree with the above.  Brendolyn Patty MD

## 2020-07-06 NOTE — Patient Instructions (Signed)
Viral Warts & Molluscum Contagiosum  Viral warts and molluscum contagiosum are growths of the skin caused by viral infection of the skin. If you have been given the diagnosis of viral warts or molluscum contagiosum there are a few things that you must understand about your condition:  1. There is no guaranteed treatment method available for this condition. 2. Multiple treatments may be required, 3. The treatments may be time consuming and require multiple visits to the dermatology office. 4. The treatment may be expensive. You will be charged each time you come into the office to have the spots treated. 5. The treated areas may develop new lesions further complicating treatment. 6. The treated areas may leave a scar. 7. There is no guarantee that even after multiple treatments that the spots will be successfully treated. 8. These are caused by a viral infection and can be spread to other areas of the skin and to other people by direct contact. Therefore, new spots may occur. Cryotherapy Aftercare  . Wash gently with soap and water everyday.   . Apply Vaseline and Band-Aid daily until healed.  

## 2020-08-03 ENCOUNTER — Other Ambulatory Visit: Payer: Self-pay

## 2020-08-03 ENCOUNTER — Ambulatory Visit (INDEPENDENT_AMBULATORY_CARE_PROVIDER_SITE_OTHER): Payer: Medicaid Other | Admitting: Dermatology

## 2020-08-03 DIAGNOSIS — D485 Neoplasm of uncertain behavior of skin: Secondary | ICD-10-CM | POA: Diagnosis not present

## 2020-08-03 NOTE — Patient Instructions (Signed)

## 2020-08-03 NOTE — Progress Notes (Signed)
   Follow-Up Visit   Subjective  Theresa Turner is a 19 y.o. female who presents for the following: Cyst (scalp, pt presents for excision).   The following portions of the chart were reviewed this encounter and updated as appropriate:      Review of Systems:  No other skin or systemic complaints except as noted in HPI or Assessment and Plan.  Objective  Well appearing patient in no apparent distress; mood and affect are within normal limits.  A focused examination was performed including scalp. Relevant physical exam findings are noted in the Assessment and Plan.  Objective  Right Central Occipital Scalp: 1.1 x 0.9cm firm sq nodule   Assessment & Plan  Neoplasm of uncertain behavior of skin Right Central Occipital Scalp  Skin excision  Lesion length (cm):  1.1 Lesion width (cm):  0.9 Margin per side (cm):  0 Total excision diameter (cm):  1.1 Informed consent: discussed and consent obtained   Timeout: patient name, date of birth, surgical site, and procedure verified   Procedure prep:  Patient was prepped and draped in usual sterile fashion Prep type:  Povidone-iodine Anesthesia: the lesion was anesthetized in a standard fashion   Anesthetic:  1% lidocaine w/ epinephrine 1-100,000 buffered w/ 8.4% NaHCO3 Instrument used comment:  #15c Hemostasis achieved with: pressure   Outcome: patient tolerated procedure well with no complications    Skin repair Complexity:  Simple Final length (cm):  1.2 Undermining: edges could be approximated without difficulty   Fine/surface layer approximation (top stitches):  Suture size:  3-0 Suture type: Prolene (polypropylene)   Suture type comment:  Nylon Stitches: vertical mattress and simple interrupted   Hemostasis achieved with: suture and pressure Outcome: patient tolerated procedure well with no complications   Post-procedure details: wound care instructions given   Dressing: mupirocin.   Additional details:  Mupirocin  ointment applied    Specimen 1 - Surgical pathology Differential Diagnosis: Cyst vs other Check Margins: No 1.1 x 0.9cm firm sq nodule  Return in about 1 week (around 08/10/2020) for SR, wart f/up.  IJamesetta Orleans, CMA, am acting as scribe for Brendolyn Patty, MD .  Documentation: I have reviewed the above documentation for accuracy and completeness, and I agree with the above.  Brendolyn Patty MD

## 2020-08-04 ENCOUNTER — Telehealth: Payer: Self-pay

## 2020-08-04 NOTE — Telephone Encounter (Signed)
Left message for patient to call with any problems or questions from surgery yesterday.  

## 2020-08-10 ENCOUNTER — Ambulatory Visit (INDEPENDENT_AMBULATORY_CARE_PROVIDER_SITE_OTHER): Payer: Medicaid Other | Admitting: Dermatology

## 2020-08-10 ENCOUNTER — Other Ambulatory Visit: Payer: Self-pay

## 2020-08-10 DIAGNOSIS — L7211 Pilar cyst: Secondary | ICD-10-CM

## 2020-08-10 DIAGNOSIS — B079 Viral wart, unspecified: Secondary | ICD-10-CM | POA: Diagnosis not present

## 2020-08-10 NOTE — Progress Notes (Signed)
   Follow-Up Visit   Subjective  Theresa Turner is a 19 y.o. female who presents for the following: Suture removal (Pilar cyst of the scalp) and Warts (fingers, Left great toe. Improving with LN2 treatment.).   The following portions of the chart were reviewed this encounter and updated as appropriate:      Review of Systems:  No other skin or systemic complaints except as noted in HPI or Assessment and Plan.  Objective  Well appearing patient in no apparent distress; mood and affect are within normal limits.  A focused examination was performed including scalp, fingers, left great toe. Relevant physical exam findings are noted in the Assessment and Plan.  Objective  Right Central Occipital Scalp: Healing excision site. No evidence of infection  Objective  Right Index Fingertip Palmar x 1, Left plantar great toe x 1, L index finger x 1 (3): Verrucous papule 2.1mm of the right palmar index finger. Small residual verrucous papule on left plantar great toe and L index palmar Discussed viral etiology and contagion.      Assessment & Plan  Pilar cyst Right Central Occipital Scalp  Wound cleansed and sutures removed.  Discussed pathology results. Benign cyst  Viral warts, unspecified type (3) Right Index Fingertip Palmar x 1, Left plantar great toe x 1, L index finger x 1  Improving. Discussed viral etiology and risk of spread.  Discussed multiple treatments may be required to clear warts.  Discussed possible post-treatment dyspigmentation and risk of recurrence.   Destruction of lesion - Right Index Fingertip Palmar x 1, Left plantar great toe x 1, L index finger x 1  Destruction method: cryotherapy   Informed consent: discussed and consent obtained   Lesion destroyed using liquid nitrogen: Yes   Region frozen until ice ball extended beyond lesion: Yes   Outcome: patient tolerated procedure well with no complications   Post-procedure details: wound care instructions  given   Additional details:  Prior to procedure, discussed risks of blister formation, small wound, skin dyspigmentation, or rare scar following cryotherapy.     Return if symptoms worsen or fail to improve.   Documentation: I have reviewed the above documentation for accuracy and completeness, and I agree with the above.  Brendolyn Patty MD

## 2020-08-10 NOTE — Patient Instructions (Signed)
Cryotherapy Aftercare  . Wash gently with soap and water everyday.   . Apply Vaseline and Band-Aid daily until healed.  

## 2021-05-18 ENCOUNTER — Other Ambulatory Visit (HOSPITAL_COMMUNITY)
Admission: RE | Admit: 2021-05-18 | Discharge: 2021-05-18 | Disposition: A | Discharge status: Home / Self Care | Payer: Medicaid Other | Source: Ambulatory Visit | Attending: Obstetrics and Gynecology | Admitting: Obstetrics and Gynecology

## 2021-05-18 ENCOUNTER — Encounter: Payer: Self-pay | Admitting: Obstetrics and Gynecology

## 2021-05-18 ENCOUNTER — Other Ambulatory Visit: Payer: Self-pay

## 2021-05-18 ENCOUNTER — Ambulatory Visit (INDEPENDENT_AMBULATORY_CARE_PROVIDER_SITE_OTHER): Payer: Medicaid Other | Admitting: Obstetrics and Gynecology

## 2021-05-18 VITALS — BP 110/80 | Ht 65.0 in | Wt 253.0 lb

## 2021-05-18 DIAGNOSIS — Z124 Encounter for screening for malignant neoplasm of cervix: Secondary | ICD-10-CM | POA: Diagnosis not present

## 2021-05-18 DIAGNOSIS — Z113 Encounter for screening for infections with a predominantly sexual mode of transmission: Secondary | ICD-10-CM

## 2021-05-18 DIAGNOSIS — N898 Other specified noninflammatory disorders of vagina: Secondary | ICD-10-CM

## 2021-05-18 LAB — POCT WET PREP WITH KOH
Clue Cells Wet Prep HPF POC: NEGATIVE
KOH Prep POC: NEGATIVE
Trichomonas, UA: NEGATIVE
Yeast Wet Prep HPF POC: NEGATIVE

## 2021-05-18 MED ORDER — VALACYCLOVIR HCL 500 MG PO TABS: 500.0000 mg | ORAL_TABLET | Freq: Every day | ORAL | 11 refills | Status: AC
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
## 2021-05-18 NOTE — Patient Instructions (Signed)
I value your feedback and you entrusting us with your care. If you get a [facility] patient survey, I would appreciate you taking the time to let us know about your experience today. Thank you! ? ? ?

## 2021-05-19 DIAGNOSIS — A6000 Herpesviral infection of urogenital system, unspecified: Secondary | ICD-10-CM

## 2021-05-19 HISTORY — DX: Herpesviral infection of urogenital system, unspecified: A60.00

## 2021-05-21 ENCOUNTER — Telehealth: Payer: Self-pay | Admitting: Obstetrics and Gynecology

## 2021-05-21 ENCOUNTER — Encounter: Payer: Self-pay | Admitting: Obstetrics and Gynecology

## 2021-05-21 LAB — CYTOLOGY - PAP
Chlamydia: NEGATIVE
Comment: NEGATIVE
Comment: NORMAL
Diagnosis: NEGATIVE
Neisseria Gonorrhea: NEGATIVE

## 2021-05-21 LAB — HSV NAA
HSV 1 NAA: POSITIVE — AB
HSV 2 NAA: NEGATIVE

## 2021-05-21 NOTE — Telephone Encounter (Signed)
Mailbox full, unable to leave msg. No MyChart.

## 2021-05-25 NOTE — Telephone Encounter (Signed)
LM with pt's mom to return call.

## 2021-05-31 NOTE — Telephone Encounter (Signed)
Pt calling for test results.  [phone] Pt aware ABC is not in office but msg will be forwarded to her.

## 2021-06-07 NOTE — Telephone Encounter (Signed)
Spoke with pt. 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 Doing daily valtrex since sx recurrent and doing well so far. Will f/u prn.

## 2021-08-18 ENCOUNTER — Telehealth: Payer: Self-pay

## 2021-08-18 NOTE — Telephone Encounter (Signed)
Pt was given 1 yr Rx valtrex to take daily. Pls f/u with her re: Rx RF at her pharmacy.

## 2021-08-18 NOTE — Telephone Encounter (Signed)
Pt aware.

## 2021-08-18 NOTE — Telephone Encounter (Signed)
Pt calling; needs refill of rx; doesn't remember the name; startes with a 'V'; needs ASAP.  [phone]

## 2021-12-06 ENCOUNTER — Other Ambulatory Visit: Payer: Self-pay | Admitting: Student

## 2021-12-06 ENCOUNTER — Other Ambulatory Visit (HOSPITAL_COMMUNITY): Payer: Self-pay | Admitting: Student

## 2021-12-06 DIAGNOSIS — G43019 Migraine without aura, intractable, without status migrainosus: Secondary | ICD-10-CM

## 2021-12-23 ENCOUNTER — Other Ambulatory Visit: Payer: Self-pay

## 2021-12-23 ENCOUNTER — Ambulatory Visit
Admission: RE | Admit: 2021-12-23 | Discharge: 2021-12-23 | Disposition: A | Discharge status: Home / Self Care | Payer: Medicaid Other | Source: Ambulatory Visit | Attending: Student | Admitting: Student

## 2021-12-23 ENCOUNTER — Ambulatory Visit: Payer: Medicaid Other

## 2021-12-23 ENCOUNTER — Other Ambulatory Visit: Payer: Medicaid Other

## 2021-12-23 DIAGNOSIS — G43019 Migraine without aura, intractable, without status migrainosus: Secondary | ICD-10-CM | POA: Diagnosis present

## 2021-12-23 MED ORDER — GADOBUTROL 1 MMOL/ML IV SOLN
10.0000 mL | Freq: Once | INTRAVENOUS | Status: AC | PRN
  Administered 2021-12-23: 10 mL via INTRAVENOUS

## 2022-06-14 ENCOUNTER — Other Ambulatory Visit: Payer: Self-pay

## 2022-06-14 DIAGNOSIS — M549 Dorsalgia, unspecified: Secondary | ICD-10-CM

## 2022-06-22 ENCOUNTER — Other Ambulatory Visit: Payer: Self-pay

## 2022-06-22 DIAGNOSIS — M549 Dorsalgia, unspecified: Secondary | ICD-10-CM

## 2022-07-09 ENCOUNTER — Ambulatory Visit
Admission: RE | Admit: 2022-07-09 | Discharge: 2022-07-09 | Disposition: A | Discharge status: Home / Self Care | Payer: Disability Insurance | Source: Ambulatory Visit

## 2022-07-09 DIAGNOSIS — M549 Dorsalgia, unspecified: Secondary | ICD-10-CM | POA: Diagnosis present

## 2023-02-24 ENCOUNTER — Ambulatory Visit (INDEPENDENT_AMBULATORY_CARE_PROVIDER_SITE_OTHER): Payer: Medicaid Other | Admitting: Nurse Practitioner

## 2023-02-24 ENCOUNTER — Encounter: Payer: Self-pay | Admitting: Nurse Practitioner

## 2023-02-24 VITALS — BP 120/84 | HR 97 | Ht 65.0 in | Wt 256.6 lb

## 2023-02-24 DIAGNOSIS — M545 Low back pain, unspecified: Secondary | ICD-10-CM | POA: Diagnosis not present

## 2023-02-24 DIAGNOSIS — R5383 Other fatigue: Secondary | ICD-10-CM | POA: Diagnosis not present

## 2023-02-24 DIAGNOSIS — R7301 Impaired fasting glucose: Secondary | ICD-10-CM

## 2023-02-24 NOTE — Patient Instructions (Signed)
1) LS Xrays 2) Fasting labs today 3) CPE completed and WNL 4) Pt will call back to sched upcoming appt

## 2023-02-24 NOTE — Progress Notes (Signed)
Established Patient Office Visit  Subjective:  Patient ID: Theresa Turner, female    DOB: 2001-11-22  Age: 22 y.o. MRN: [id number]  Chief Complaint  Patient presents with   Annual Exam    Physical    Today patient would like a head to toe physical.  Back problems, lower pain, chronic.  Xrays.  Labs today, fasting.       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   Post traumatic stress disorder     Past Surgical History:  Procedure Laterality Date   HYMENECTOMY     LABIOPLASTY  05/2016   [facility]   WISDOM TOOTH EXTRACTION  05/2019    Social History   Socioeconomic History   Marital status: Single    Spouse name: Not on file   Number of children: Not on file   Years of education: Not on file   Highest education level: Not on file  Occupational History   Not on file  Tobacco Use   Smoking status: Never   Smokeless tobacco: Never  Vaping Use   Vaping Use: Never used  Substance and Sexual Activity   Alcohol use: No   Drug use: Not Currently   Sexual activity: Yes    Birth control/protection: Condom, Pill  Other Topics Concern   Not on file  Social History Narrative   Not on file   Social Determinants of Health   Financial Resource Strain: Not on file  Food Insecurity: Not on file  Transportation Needs: Not on file  Physical Activity: Not on file  Stress: Not on file  Social Connections: Not on file  Intimate Partner Violence: Not on file    Family History  Problem Relation Age of Onset   Diabetes Mother    Breast cancer Other     Allergies  Allergen Reactions   Prednisone Other (See Comments)    Unable to sleep    Review of Systems  Constitutional: Negative.   HENT: Negative.    Eyes: Negative.   Respiratory: Negative.    Cardiovascular: Negative.   Gastrointestinal: Negative.   Genitourinary: Negative.    Musculoskeletal:  Positive for back pain and myalgias.  Skin: Negative.   Neurological:  Positive for headaches.  Endo/Heme/Allergies:  Bruises/bleeds easily.  Psychiatric/Behavioral:  Positive for depression. The patient is nervous/anxious and has insomnia.        Objective:   BP 120/84   Pulse 97   Ht '5\' 5"'$  (1.651 m)   Wt 256 lb 9.6 oz (116.4 kg)   SpO2 98%   BMI 42.70 kg/m   Vitals:   02/24/23 1044  BP: 120/84  Pulse: 97  Height: '5\' 5"'$  (1.651 m)  Weight: 256 lb 9.6 oz (116.4 kg)  SpO2: 98%  BMI (Calculated): 42.7    Physical Exam Vitals reviewed.  Constitutional:      Appearance: Normal appearance.  HENT:     Head: Normocephalic.     Nose: Nose normal.     Mouth/Throat:     Mouth: Mucous membranes are moist.  Eyes:     Pupils: Pupils are equal, round, and reactive to light.  Cardiovascular:     Rate and Rhythm: Normal rate and regular rhythm.  Pulmonary:     Effort: Pulmonary effort is normal.     Breath sounds: Normal breath sounds.  Abdominal:     General:  Bowel sounds are normal.     Palpations: Abdomen is soft.  Musculoskeletal:        General: Normal range of motion.     Cervical back: Normal range of motion and neck supple.  Skin:    General: Skin is warm and dry.  Neurological:     Mental Status: She is alert and oriented to person, place, and time.  Psychiatric:        Mood and Affect: Mood normal.        Behavior: Behavior normal.      No results found for any visits on 02/24/23.  No results found for this or any previous visit (from the past 2160 hour(s)).    Assessment & Plan:   Problem List Items Addressed This Visit       Other   Low back pain - Primary   Relevant Orders   DG Lumbar Spine Complete   Other fatigue    No follow-ups on file.   Total time spent: 35 minutes  Evern Bio, NP  02/24/2023

## 2023-02-25 LAB — CMP14+EGFR
ALT: 14 IU/L (ref 0–32)
AST: 15 IU/L (ref 0–40)
Albumin/Globulin Ratio: 1.6 (ref 1.2–2.2)
Albumin: 4.5 g/dL (ref 4.0–5.0)
Alkaline Phosphatase: 85 IU/L (ref 44–121)
BUN/Creatinine Ratio: 15 (ref 9–23)
BUN: 13 mg/dL (ref 6–20)
Bilirubin Total: 0.3 mg/dL (ref 0.0–1.2)
CO2: 21 mmol/L (ref 20–29)
Calcium: 9.3 mg/dL (ref 8.7–10.2)
Chloride: 102 mmol/L (ref 96–106)
Creatinine, Ser: 0.85 mg/dL (ref 0.57–1.00)
Globulin, Total: 2.9 g/dL (ref 1.5–4.5)
Glucose: 93 mg/dL (ref 70–99)
Potassium: 4.2 mmol/L (ref 3.5–5.2)
Sodium: 138 mmol/L (ref 134–144)
Total Protein: 7.4 g/dL (ref 6.0–8.5)
eGFR: 99 mL/min/{1.73_m2} (ref >59)

## 2023-02-25 LAB — LIPID PANEL
Chol/HDL Ratio: 6.2 ratio — ABNORMAL HIGH (ref 0.0–4.4)
Cholesterol, Total: 191 mg/dL (ref 100–199)
HDL: 31 mg/dL — ABNORMAL LOW (ref >39)
LDL Chol Calc (NIH): 136 mg/dL — ABNORMAL HIGH (ref 0–99)
Triglycerides: 132 mg/dL (ref 0–149)
VLDL Cholesterol Cal: 24 mg/dL (ref 5–40)

## 2023-02-25 LAB — HEMOGLOBIN A1C
Est. average glucose Bld gHb Est-mCnc: 108 mg/dL
Hgb A1c MFr Bld: 5.4 % (ref 4.8–5.6)

## 2023-02-25 LAB — TSH: TSH: 2.9 u[IU]/mL (ref 0.450–4.500)

## 2023-03-06 ENCOUNTER — Other Ambulatory Visit: Payer: Medicaid Other

## 2023-03-10 ENCOUNTER — Other Ambulatory Visit: Payer: Self-pay

## 2023-03-10 MED ORDER — SIMVASTATIN 20 MG PO TABS: 20.0000 mg | ORAL_TABLET | Freq: Every day | ORAL | 11 refills | Status: AC

## 2023-03-22 ENCOUNTER — Other Ambulatory Visit: Payer: Self-pay | Admitting: Nurse Practitioner

## 2023-05-03 ENCOUNTER — Encounter: Payer: Self-pay | Admitting: Nurse Practitioner

## 2023-05-04 ENCOUNTER — Other Ambulatory Visit: Payer: Self-pay | Admitting: Nurse Practitioner

## 2023-05-04 DIAGNOSIS — J039 Acute tonsillitis, unspecified: Secondary | ICD-10-CM

## 2023-05-31 ENCOUNTER — Other Ambulatory Visit: Payer: Self-pay | Admitting: Student

## 2023-05-31 DIAGNOSIS — G43019 Migraine without aura, intractable, without status migrainosus: Secondary | ICD-10-CM

## 2023-05-31 DIAGNOSIS — G932 Benign intracranial hypertension: Secondary | ICD-10-CM

## 2023-05-31 DIAGNOSIS — R55 Syncope and collapse: Secondary | ICD-10-CM

## 2023-05-31 DIAGNOSIS — G90A Postural orthostatic tachycardia syndrome (POTS): Secondary | ICD-10-CM

## 2023-07-04 ENCOUNTER — Ambulatory Visit: Payer: Medicaid Other | Admitting: Internal Medicine

## 2023-09-13 IMAGING — MR MR [PERSON_NAME] HEAD
1 of 3 series · 31 of 48 positions shown · IV contrast (gadavist)
Comparison: MRI of the brain and orbits 07/05/2019.

CLINICAL DATA: Intractable migraine without Jasinto and without status
migrainosus. Additional history provided by scanning technologist:
Patient reports headaches/migraines with pressure behind eyes,
dizziness, associated nausea, symptoms since 2929.

EXAM:
MRI HEAD WITHOUT AND WITH CONTRAST
MR VENOGRAM HEAD WITHOUT CONTRAST
TECHNIQUE: Multiplanar, multi-echo pulse sequences of the brain and surrounding
structures were acquired without and with intravenous contrast.
Angiographic images of the intracranial venous structures were
acquired using MRV technique without intravenous contrast.
CONTRAST:  10mL GADAVIST GADOBUTROL 1 MMOL/ML IV SOLN

[Series 5: TOF · coronal · 2.5mm · 0.98mm/px · 31 of 127 slices shown]
[im 1/127]
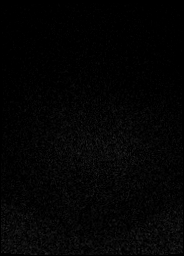
[im 5/127]
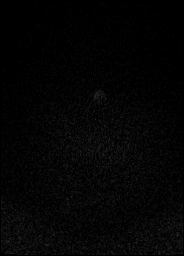
[im 9/127]
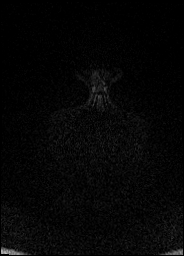
[im 13/127]
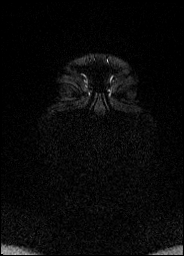
[im 17/127]
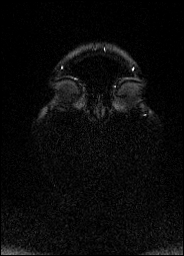
[im 22/127]
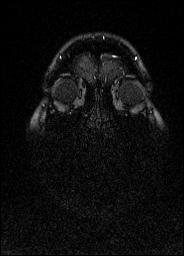
[im 26/127]
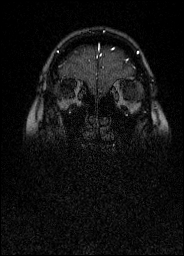
[im 30/127]
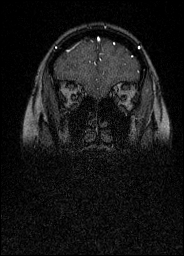
[im 34/127]
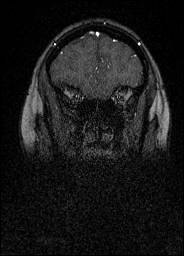
[im 38/127]
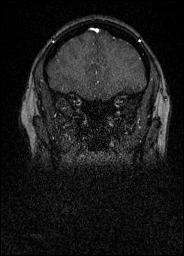
[im 43/127]
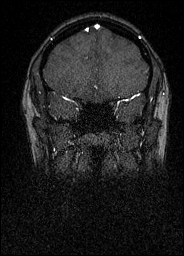
[im 47/127]
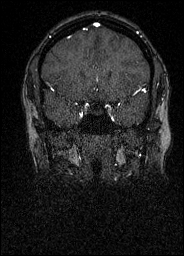
[im 51/127]
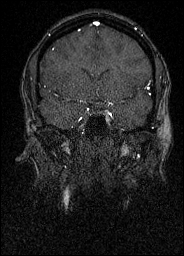
[im 55/127]
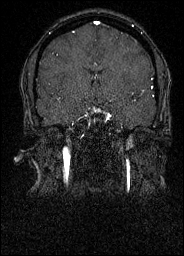
[im 59/127]
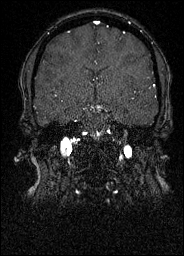
[im 64/127]
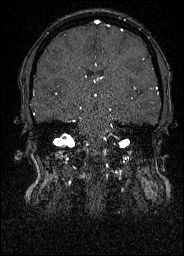
[im 68/127]
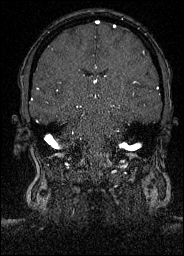
[im 72/127]
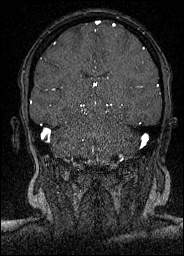
[im 76/127]
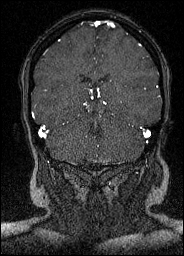
[im 80/127]
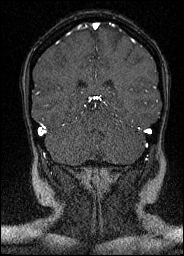
[im 85/127]
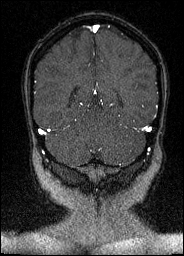
[im 89/127]
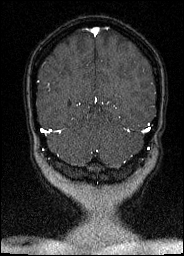
[im 93/127]
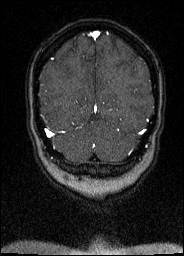
[im 97/127]
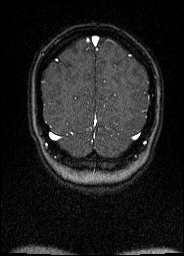
[im 101/127]
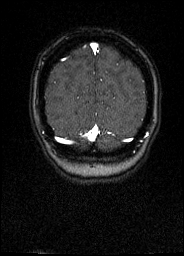
[im 106/127]
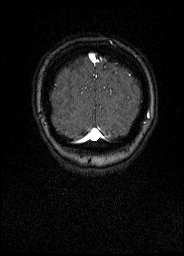
[im 110/127]
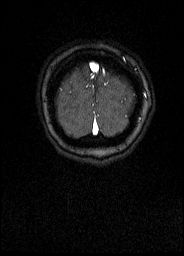
[im 114/127]
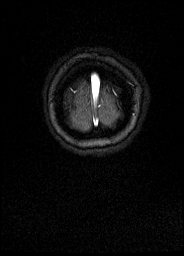
[im 118/127]
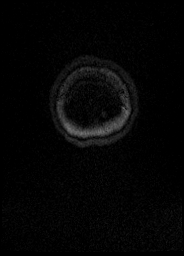
[im 122/127]
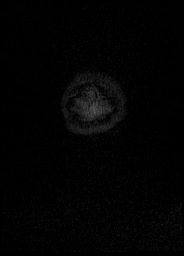
[im 127/127]
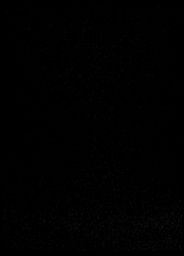

[31 of 48 positions shown; findings below may reference images not displayed]

FINDINGS: MRI HEAD WITHOUT AND WITH CONTRAST

Brain:

Cerebral volume is normal.

No cortical encephalomalacia is identified. No significant cerebral
white matter disease.

There is no acute infarct.

No evidence of an intracranial mass.

No chronic intracranial blood products.

No extra-axial fluid collection.

No midline shift.

No pathologic intracranial enhancement identified.

Vascular: Maintained flow voids within the proximal large arterial
vessels.

Skull and upper cervical spine: No focal suspicious marrow lesion.

Sinuses/Orbits: Visualized orbits show no acute finding. Minimal
mucosal thickening within the right ethmoid and bilateral maxillary
sinuses.

MR VENOGRAM HEAD WITHOUT CONTRAST

The superior sagittal sinus, internal cerebral veins, vein of Nana [place],
straight sinus, transverse sinuses, sigmoid sinuses and visualized
jugular veins are patent. No evidence of intracranial venous
thrombosis. No dural venous sinus stenosis.
IMPRESSION: MRI brain:

1. Unremarkable MRI appearance of the brain. No evidence of acute
intracranial abnormality.
2. Minimal mucosal thickening within the right ethmoid and bilateral
maxillary sinuses.

MRV head: Intractable migraine without

1. No evidence of intracranial venous thrombosis.
2. No dural venous sinus stenosis.

## 2024-03-05 ENCOUNTER — Telehealth: Payer: Self-pay

## 2024-03-05 NOTE — Telephone Encounter (Signed)
 Pts momDebra called wanting to know when the last time pt was tested for STD. Pts mom aware we are not allowed to release any information to anyone not on a updated DPR. Debra pts mom states she will have the pt Theresa Turner call to get her results.
